# Patient Record
Sex: Male | Born: 1950 | Race: Black or African American | Hispanic: No | Marital: Single | Smoking: Current every day smoker
Health system: Southern US, Community
[De-identification: ages and names within clinical notes are randomized; demographics above are authoritative.]

## PROBLEM LIST (undated history)

## (undated) DIAGNOSIS — I10 Essential (primary) hypertension: Secondary | ICD-10-CM

---

## 2009-10-19 ENCOUNTER — Emergency Department: Payer: Self-pay | Admitting: Emergency Medicine

## 2010-10-20 ENCOUNTER — Emergency Department: Payer: Self-pay | Admitting: Emergency Medicine

## 2012-05-23 ENCOUNTER — Inpatient Hospital Stay: Payer: Self-pay | Admitting: Psychiatry

## 2012-05-23 LAB — COMPREHENSIVE METABOLIC PANEL
Albumin: 3.6 g/dL (ref 3.4–5.0)
Alkaline Phosphatase: 98 U/L (ref 50–136)
Anion Gap: 5 — ABNORMAL LOW (ref 7–16)
BUN: 11 mg/dL (ref 7–18)
Calcium, Total: 8.9 mg/dL (ref 8.5–10.1)
EGFR (African American): >60
Glucose: 72 mg/dL (ref 65–99)
SGOT(AST): 53 U/L — ABNORMAL HIGH (ref 15–37)
SGPT (ALT): 60 U/L (ref 12–78)
Total Protein: 7.8 g/dL (ref 6.4–8.2)

## 2012-05-23 LAB — CBC
HCT: 43.4 % (ref 40.0–52.0)
MCH: 35.1 pg — ABNORMAL HIGH (ref 26.0–34.0)
MCHC: 34.7 g/dL (ref 32.0–36.0)
RDW: 13.1 % (ref 11.5–14.5)

## 2012-05-23 LAB — ETHANOL
Ethanol %: 0.023 % (ref 0.000–0.080)
Ethanol: 23 mg/dL

## 2012-05-23 LAB — DRUG SCREEN, URINE
Amphetamines, Ur Screen: NEGATIVE (ref ?–1000)
Benzodiazepine, Ur Scrn: NEGATIVE (ref ?–200)
Cannabinoid 50 Ng, Ur ~~LOC~~: NEGATIVE (ref ?–50)
Cocaine Metabolite,Ur ~~LOC~~: POSITIVE (ref ?–300)
Methadone, Ur Screen: NEGATIVE (ref ?–300)
Opiate, Ur Screen: NEGATIVE (ref ?–300)
Phencyclidine (PCP) Ur S: NEGATIVE (ref ?–25)

## 2012-05-23 LAB — URINALYSIS, COMPLETE
Blood: NEGATIVE
Leukocyte Esterase: NEGATIVE
Nitrite: NEGATIVE
Protein: NEGATIVE
RBC,UR: 2 /HPF (ref 0–5)
Specific Gravity: 1.024 (ref 1.003–1.030)
WBC UR: 5 /HPF (ref 0–5)

## 2012-05-23 LAB — TSH: Thyroid Stimulating Horm: 4.12 u[IU]/mL

## 2012-05-23 LAB — ACETAMINOPHEN LEVEL: Acetaminophen: <2 ug/mL

## 2012-05-24 LAB — BEHAVIORAL MEDICINE 1 PANEL
Alkaline Phosphatase: 127 U/L (ref 50–136)
Anion Gap: 4 — ABNORMAL LOW (ref 7–16)
BUN: 15 mg/dL (ref 7–18)
Basophil #: 0.1 10*3/uL (ref 0.0–0.1)
Bilirubin,Total: 0.3 mg/dL (ref 0.2–1.0)
Calcium, Total: 8.6 mg/dL (ref 8.5–10.1)
Chloride: 111 mmol/L — ABNORMAL HIGH (ref 98–107)
Co2: 27 mmol/L (ref 21–32)
Creatinine: 0.84 mg/dL (ref 0.60–1.30)
EGFR (Non-African Amer.): >60
Eosinophil #: 0.4 10*3/uL (ref 0.0–0.7)
Eosinophil %: 6.4 %
HCT: 39.9 % — ABNORMAL LOW (ref 40.0–52.0)
Lymphocyte %: 33.3 %
MCH: 34.8 pg — ABNORMAL HIGH (ref 26.0–34.0)
MCHC: 34.2 g/dL (ref 32.0–36.0)
Monocyte #: 0.6 x10 3/mm (ref 0.2–1.0)
Neutrophil #: 3.1 10*3/uL (ref 1.4–6.5)
Neutrophil %: 50.2 %
Potassium: 4.1 mmol/L (ref 3.5–5.1)
RDW: 13.2 % (ref 11.5–14.5)
SGOT(AST): 37 U/L (ref 15–37)
SGPT (ALT): 47 U/L (ref 12–78)
Sodium: 142 mmol/L (ref 136–145)
Thyroid Stimulating Horm: 1.73 u[IU]/mL
Total Protein: 6.7 g/dL (ref 6.4–8.2)

## 2012-05-25 LAB — LIPID PANEL
Ldl Cholesterol, Calc: 46 mg/dL (ref 0–100)
Triglycerides: 79 mg/dL (ref 0–200)

## 2012-05-25 LAB — CK-MB: CK-MB: 1.4 ng/mL (ref 0.5–3.6)

## 2012-05-25 LAB — TROPONIN I: Troponin-I: <0.02 ng/mL

## 2012-05-26 DIAGNOSIS — R079 Chest pain, unspecified: Secondary | ICD-10-CM

## 2013-06-19 ENCOUNTER — Emergency Department: Payer: Self-pay | Admitting: Emergency Medicine

## 2014-07-06 ENCOUNTER — Ambulatory Visit: Payer: Self-pay | Admitting: Surgery

## 2014-07-12 ENCOUNTER — Ambulatory Visit: Payer: Self-pay | Admitting: Surgery

## 2014-07-12 LAB — PROTIME-INR
INR: 1
Prothrombin Time: 13.5 secs (ref 11.5–14.7)

## 2014-07-12 LAB — BASIC METABOLIC PANEL
Anion Gap: 6 — ABNORMAL LOW (ref 7–16)
BUN: 12 mg/dL (ref 7–18)
CALCIUM: 8.8 mg/dL (ref 8.5–10.1)
CO2: 24 mmol/L (ref 21–32)
Chloride: 110 mmol/L — ABNORMAL HIGH (ref 98–107)
Creatinine: 0.78 mg/dL (ref 0.60–1.30)
EGFR (African American): >60
GLUCOSE: 96 mg/dL (ref 65–99)
Osmolality: 279 (ref 275–301)
POTASSIUM: 4.4 mmol/L (ref 3.5–5.1)
SODIUM: 140 mmol/L (ref 136–145)

## 2014-07-20 ENCOUNTER — Inpatient Hospital Stay: Payer: Self-pay | Admitting: Surgery

## 2014-07-21 LAB — PLATELET COUNT: PLATELETS: 199 10*3/uL (ref 150–440)

## 2014-07-23 LAB — CBC WITH DIFFERENTIAL/PLATELET
BASOS PCT: 0.4 %
Basophil #: 0.1 10*3/uL (ref 0.0–0.1)
EOS PCT: 0.2 %
Eosinophil #: 0 10*3/uL (ref 0.0–0.7)
HCT: 49.9 % (ref 40.0–52.0)
HGB: 16.2 g/dL (ref 13.0–18.0)
LYMPHS ABS: 2.4 10*3/uL (ref 1.0–3.6)
Lymphocyte %: 16.5 %
MCH: 32 pg (ref 26.0–34.0)
MCHC: 32.3 g/dL (ref 32.0–36.0)
MCV: 99 fL (ref 80–100)
Monocyte #: 1.3 x10 3/mm — ABNORMAL HIGH (ref 0.2–1.0)
Monocyte %: 9.1 %
Neutrophil #: 10.6 10*3/uL — ABNORMAL HIGH (ref 1.4–6.5)
Neutrophil %: 73.8 %
PLATELETS: 200 10*3/uL (ref 150–440)
RBC: 5.05 10*6/uL (ref 4.40–5.90)
RDW: 12.4 % (ref 11.5–14.5)
WBC: 14.4 10*3/uL — AB (ref 3.8–10.6)

## 2014-07-23 LAB — BASIC METABOLIC PANEL
ANION GAP: 6 — AB (ref 7–16)
BUN: 18 mg/dL (ref 7–18)
Calcium, Total: 8.7 mg/dL (ref 8.5–10.1)
Chloride: 102 mmol/L (ref 98–107)
Co2: 29 mmol/L (ref 21–32)
Creatinine: 0.93 mg/dL (ref 0.60–1.30)
EGFR (Non-African Amer.): >60
GLUCOSE: 125 mg/dL — AB (ref 65–99)
OSMOLALITY: 277 (ref 275–301)
Potassium: 4.3 mmol/L (ref 3.5–5.1)
Sodium: 137 mmol/L (ref 136–145)

## 2014-07-23 LAB — CLOSTRIDIUM DIFFICILE(ARMC)

## 2014-07-24 LAB — CBC WITH DIFFERENTIAL/PLATELET
BASOS ABS: 0.1 10*3/uL (ref 0.0–0.1)
BASOS PCT: 0.8 %
Eosinophil #: 0.1 10*3/uL (ref 0.0–0.7)
Eosinophil %: 1.2 %
HCT: 47 % (ref 40.0–52.0)
HGB: 15.6 g/dL (ref 13.0–18.0)
LYMPHS PCT: 22.1 %
Lymphocyte #: 2.5 10*3/uL (ref 1.0–3.6)
MCH: 32.4 pg (ref 26.0–34.0)
MCHC: 33.1 g/dL (ref 32.0–36.0)
MCV: 98 fL (ref 80–100)
Monocyte #: 1.2 x10 3/mm — ABNORMAL HIGH (ref 0.2–1.0)
Monocyte %: 10.6 %
NEUTROS PCT: 65.3 %
Neutrophil #: 7.4 10*3/uL — ABNORMAL HIGH (ref 1.4–6.5)
Platelet: 202 10*3/uL (ref 150–440)
RBC: 4.81 10*6/uL (ref 4.40–5.90)
RDW: 12.5 % (ref 11.5–14.5)
WBC: 11.4 10*3/uL — ABNORMAL HIGH (ref 3.8–10.6)

## 2014-10-12 NOTE — Consult Note (Signed)
Chief Complaint:   Subjective/Chief Complaint Pt with left neck/posterior shoulder pain radiating to left arm. BP elevated. Denies CP or SOB.   VITAL SIGNS/ANCILLARY NOTES: **Vital Signs.:   29-Nov-13 13:10   Temperature Temperature (F) 96.2   Pulse Pulse 68    17:31   Temperature Temperature (F) 97.8   Pulse Pulse 62   Systolic BP Systolic BP 170   Diastolic BP (mmHg) Diastolic BP (mmHg) 93    18:58   Temperature Temperature (F) 98.2   Pulse Pulse 76   Systolic BP Systolic BP 144   Diastolic BP (mmHg) Diastolic BP (mmHg) 84    21:40   Temperature Temperature (F) 98.6   Pulse Pulse 72   Systolic BP Systolic BP 212   Diastolic BP (mmHg) Diastolic BP (mmHg) 112    21:58   Systolic BP Systolic BP 200   Diastolic BP (mmHg) Diastolic BP (mmHg) 115    22:18   Pulse Ox % Pulse Ox % 99   Brief Assessment:   Cardiac Regular    Respiratory clear BS    Gastrointestinal details normal Soft  Nontender  Bowel sounds normal   Assessment/Plan:  Assessment/Plan:   Plan EKG and CXR OK. Will check troponin. Begin NTP and clonidine. Add Flexeril for musculoskeletal pain. Will follow.   Electronic Signatures: Marguarite ArbourSparks, Egon Dittus D (MD)  (Signed [phone]-Nov-13 22:51)  Authored: Chief Complaint, VITAL SIGNS/ANCILLARY NOTES, Brief Assessment, Assessment/Plan   Last Updated: 29-Nov-13 22:51 by Marguarite ArbourSparks, Nasean Zapf D (MD)

## 2014-10-12 NOTE — Consult Note (Signed)
PATIENT NAME:  Micheal Dunlap, Micheal Dunlap MR#:  [id number] DATE OF BIRTH:  [birth year]  DATE OF CONSULTATION:  05/23/2012  REFERRING PHYSICIAN:  Franki Cabot, MD CONSULTING PHYSICIAN:  Duane Lope. Bunyan Brier, MD  REASON FOR CONSULTATION: Accelerated hypertension with left shoulder pain.   HISTORY OF PRESENT ILLNESS: The patient is a 64 year old male with a history of previous back surgery and alcohol dependence admitted to the Behavioral Medicine Unit for alcohol abuse and detox. The patient was admitted earlier today. Subsequently, the patient has developed left neck and posterior shoulder pain radiating down the left arm. Denies chest pain or shortness of breath. Was noted to have accelerated hypertension at the time. Was given 0.1 mg of clonidine. Consultation was subsequently requested. The patient denies any previous cardiac history.   PAST MEDICAL HISTORY:  1. Status post previous lumbar back surgery. 2. Alcohol dependence.   MEDICATIONS ON ADMISSION: None.   ALLERGIES: No known drug allergies.   SOCIAL HISTORY: The patient has a history of both alcohol and tobacco abuse.   FAMILY HISTORY: Negative for prostate or colon cancer. Negative for coronary artery disease.   REVIEW OF SYSTEMS. CONSTITUTIONAL: No fever or change in weight. EYES: No blurred or double vision. No glaucoma. ENT: No tinnitus or hearing loss. No nasal discharge or bleeding. No difficulty swallowing. RESPIRATORY: No cough or wheezing. Denies hemoptysis. No orthopnea. CARDIOVASCULAR: No chest pain or palpitations. No syncope. GI: No nausea, vomiting, or diarrhea. No change in bowel habits. No abdominal pain. GU: No dysuria or hematuria. No incontinence. ENDOCRINE: No polyuria or polydipsia. No heat or cold intolerance. HEMATOLOGIC: The patient denies anemia, easy bruising, or bleeding. LYMPHATIC: No swollen glands. MUSCULOSKELETAL: The patient denies pain in his back, knees, or hips. No gout. NEUROLOGIC: No numbness or  weakness. Denies stroke or seizures.  PSYCH: The patient denies anxiety, insomnia, or depression.   PHYSICAL EXAMINATION:  GENERAL: The patient is in no acute distress.   VITAL SIGNS: Vital signs are currently remarkable for a blood pressure of 200/115 with a heart rate of 72 and a respiratory rate of 18. He is afebrile. Saturations 99% on room air.   HEENT: Normocephalic, atraumatic. Pupils equally round and reactive to light and accommodation. Extraocular movements are intact. Sclerae are anicteric. Conjunctivae are clear. Oropharynx is clear.   NECK: Supple without jugular venous distention. No adenopathy or thyromegaly was noted.    LUNGS: Clear to auscultation and percussion without wheezes, rales, or rhonchi. No dullness.   CARDIAC: Regular rate and rhythm with a normal S1, S2. No significant rubs, murmurs, or gallops. PMI is nondisplaced. Chest wall is nontender.   ABDOMEN: Soft, nontender with normoactive bowel sounds. No organomegaly or masses were appreciated. No hernias or bruits were noted.   EXTREMITIES: Without clubbing, cyanosis, edema. Pulses were 2+ bilaterally.   SKIN: Warm and dry without rash or lesions.   NEUROLOGIC: Cranial nerves II through XII grossly intact. Deep tendon reflexes were symmetric. Motor and sensory exam is nonfocal.   PSYCH: Exam revealed a patient who is alert and oriented to person, place, and time. He was cooperative and used good judgment.   LABORATORY DATA: EKG revealed sinus rhythm with no acute ischemic changes. Chest x-ray was unremarkable.   ASSESSMENT:  1. Accelerated hypertension.  2. Alcohol dependency with withdrawal.   3. Musculoskeletal neck and shoulder pain.   PLAN: The patient will be placed on nitroglycerin paste. We will check a troponin. Begin clonidine 0.2 mg p.o. q.8 hours routinely. We will monitor his  blood pressure closely. We will add Flexeril for his musculoskeletal pain at this time. Other plans per Psychiatry.  Thank you for the consultation. We will continue to follow this patient with you while in the hospital. Please call if questions arise.   TOTAL TIME SPENT ON THIS PATIENT: 45 minutes.     ____________________________ Duane LopeJeffrey D. Judithann SheenSparks, MD jds:vtd D: 05/23/2012 22:57:59 ET T: 05/24/2012 09:51:15 ET JOB#: [id number]  cc: Duane LopeJeffrey D. Judithann SheenSparks, MD, <Dictator> Ragan Reale Rodena Medin Macaila Tahir MD ELECTRONICALLY SIGNED 05/25/2012 13:18

## 2014-10-18 LAB — SURGICAL PATHOLOGY

## 2014-10-24 NOTE — Discharge Summary (Signed)
PATIENT NAME:  Micheal Dunlap, Micheal Dunlap MR#:  [id number] DATE OF BIRTH:  23-Oct-1950  DATE OF ADMISSION:  07/20/2014 DATE OF DISCHARGE:  07/24/2014  HISTORY OF PRESENT ILLNESS:  This 64 year old male was admitted for a laparoscopic right colectomy. He had a recent colonoscopy with findings of a 30 mm semi-sessile polyp found at the hepatic flexure. A portion of this polyp was removed with saline injection lift technique and hot snare. Resection was incomplete. The site was injected with 2 mm of carbon black. Pathology demonstrated a tubular adenoma.   PAST MEDICAL HISTORY:  Also includes hepatitis C.   Other details are recorded on the typed H and P.   HOSPITAL COURSE:  The patient came in through the outpatient surgery department prepared for surgery. He did have a preoperative prophylactic antibiotic and was carried to the operating room, where he had a laparoscopic right colectomy. Postoperatively, he was kept in the hospital for routine care and was given IV fluids. He was initially begun on a clear liquid diet and gradually advanced to full liquids and to solid food.   While in the hospital, he did have significant hypertension with diastolic pressure higher than 045100. He was treated with Vasotec and gradually increased on the dose up to 10 mg 2 times per day. He also had internal medicine consultation. Amlodipine 5 mg per day was added, and his blood pressure was normal after arriving at this course of treatment.   His wound progressed satisfactorily.   Final pathology demonstrated 2 tubular adenomas of the right colon.   FINAL DIAGNOSES:  1.  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  2.  Hypertension.   DISCHARGE INSTRUCTIONS:  Include taking Vasotec 10 mg 2 times per day and amlodipine 5 mg one time per day.   Wound care instructions were given.   Take a regular diet, but limit salt.   PLAN:  Follow up in the office and also will need followup internal medicine survey care for his  hypertension.   ____________________________ Shela CommonsJ. Renda RollsWilton Smith, MD jws:nb D: 08/02/2014 17:19:08 ET T: 08/03/2014 00:27:36 ET JOB#: [id number]  cc: Adella HareJ. Wilton Smith, MD, <Dictator> Adella HareWILTON J SMITH MD ELECTRONICALLY SIGNED 08/05/2014 15:19

## 2014-10-24 NOTE — Consult Note (Signed)
PATIENT NAME:  Micheal Dunlap, Micheal Dunlap MR#:  [id number] DATE OF BIRTH:  Nov 11, 1950  DATE OF CONSULTATION:  07/22/2014  REFERRING PHYSICIAN:   Renda Rolls, MD  CONSULTING PHYSICIAN:  Hope Pigeon. Elisabeth Pigeon, MD  REASON FOR CONSULTATION:  Hypertension.   HISTORY OF PRESENT ILLNESS: This is a 64 year old male who has no known medical history, and he is not following with any doctor, said that last month he just walked into [facility] and said he has to have a work up for cancer.  He wanted to rule it out and he was referred to Dr. Shelle Iron.  He did a colonoscopy and found he has tubular adenoma and was referred to Dr. Michaelle Copas office.  Dr. Katrinka Blazing called him for scheduled surgery and laparoscopic right colectomy, because of tubular adenoma.  Though I do not believe this story in detail, but he got right hemicolectomy for tubular adenoma and he is admitted for that on the surgical floor currently.  His blood pressure is running high, and so medical services called in for further management of that.  He denies any complaints, started walking after surgery and tolerating fine liquid diet and had a bowel movement today.   REVIEW OF SYSTEMS: CONSTITUTIONAL: Negative for fever, fatigue, weakness, pain or weight loss.  EYES: No blurring, double vision, discharge or redness.  EARS, NOSE, THROAT: No tinnitus, ear pain or hearing loss.  RESPIRATORY: No cough, wheezing, hemoptysis, or shortness of breath.  CARDIOVASCULAR: No chest pain, orthopnea, edema, arrhythmia, palpitations.  GASTROINTESTINAL: No nausea, vomiting, diarrhea, had abdominal pain.  GENITOURINARY: No dysuria, hematuria, increased frequency.  ENDOCRINE: No heat or cold intolerance. No excessive sweating.  SKIN: No acne, rashes, or lesions.  MUSCULOSKELETAL: No pain or swelling in the joints.  NEUROLOGICAL: No numbness, weakness, tremor or vertigo.  PSYCHIATRIC: No anxiety, insomnia, bipolar disorder.   PAST MEDICAL HISTORY: Not known, as he  does not go to any doctor.   PAST SURGICAL HISTORY: He had right hemicolectomy laparoscopy he is done on 07/20/2014, 2 days ago.   SOCIAL HISTORY: Lives with his mother, independent in day-to-day activity. He does not work.  He smokes 2 to 3 cigarettes every day. Denies alcohol or illegal drug use.   FAMILY HISTORY: Negative for coronary artery disease, diabetes.  His mother is alive and healthy, and father died of old age.   HOME MEDICATIONS: None.   PHYSICAL EXAMINATION:  VITAL SIGNS: Blood pressure 162/110, respirations 18, heart rate 98, temperature 97.2, and pulse oximetry is 96% on room air.  GENERAL: The patient is fully alert and oriented to time, place, and person. Does not appear in any acute distress.  HEENT:  Head atraumatic, PERRLA, mucous membranes are moist.  NECK: Supple. No JVD, thyroid non tender.  RESPIRATORY: Bilateral equal and clear air entry. CARDIOVASCULAR: S1, S2 present. No wheezing or crepitation, no palpitations. No tenderness on local palpation.   ABDOMEN: There is a scar of surgery and dressing present.  Abdomen is slightly distended, but bowel sounds present and mild tenderness over surgical site. No organomegaly felt.  SKIN: No acne, rashes, or lesions.  MUSCULOSKELETAL: No tenderness or swelling.  EXTREMITIES:  Legs: No edema.  NEUROLOGICAL: Power 5/5, follows commands. No tremor or rigidity. Sensation is intact.  PSYCHIATRIC: Does not appear in any acute psychiatric illness at this time.   IMPORTANT LABORATORY RESULTS:  Platelet count 199,000. There are no other labs done as he is admitted primarily to surgical service.   ASSESSMENT AND PLAN:  A 64 year old male  who has no known past medical history, but does not follow with any doctor.  Medical consult is called in for hypertension management after he is admitted to surgical service for laparoscopic right hemicolectomy.   1.  Hypertension.  He is already on medication. I will add amlodipine.  He was on IV  fluid, which is stopped now, so hopefully that will also help to better control his blood pressure.  2.  Tubular adenoma, status post hemicolectomy. This was already done by surgery and management per primary surgical team. He appears already recovering from that.  3.  Smoking. Smoking cessation counseling is done for 4 minutes and offered him supplement, but he does not want that and he is fine without that.    TOTAL TIME SPENT ON THIS CONSULT: 45 minutes. We will continue following with you.     ____________________________ Hope PigeonVaibhavkumar G. Elisabeth PigeonVachhani, MD vgv:DT D: 07/22/2014 14:16:53 ET T: 07/22/2014 15:11:44 ET JOB#: [id number]  cc: Hope PigeonVaibhavkumar G. Elisabeth PigeonVachhani, MD, <Dictator> Altamese DillingVAIBHAVKUMAR Jakobie Henslee MD ELECTRONICALLY SIGNED 08/10/2014 10:41

## 2014-10-24 NOTE — Op Note (Signed)
PATIENT NAME:  Micheal Dunlap, Micheal Dunlap MR#:  [id number] DATE OF BIRTH:  05/20/1951  DATE OF PROCEDURE:  07/20/2014  PREOPERATIVE DIAGNOSIS: Tubular adenoma of the hepatic flexure of the colon.   POSTOPERATIVE DIAGNOSIS: Tubular adenoma of the hepatic flexure of the colon.   PROCEDURE: Laparoscopic right colectomy.   SURGEON: Adella HareJ. Wilton Smith, MD   ANESTHESIA: General.   INDICATIONS: This 64 year old male recently had colonoscopy findings of a 30 mm tubular adenoma of the hepatic flexure of the colon and parts of this could be removed, but could not all be removed and surgery was recommended for definitive treatment. Dye was injected locally during the colonoscopy.   DESCRIPTION OF PROCEDURE: The patient was placed on the operating table in the supine position under general endotracheal anesthesia. The abdomen was prepared with ChloraPrep, draped in a sterile manner.   A short incision was made just below the umbilicus, carried down to the deep fascia, and lifted the fascia with the laryngeal hook. A Veress needle was inserted into the peritoneal cavity. It was irrigated and aspirated, and subsequently insufflated the peritoneal cavity with carbon dioxide. The Veress needle was removed. The 10 mm cannula was inserted. The 10 mm, 0-degree laparoscope was inserted to view the peritoneal cavity. There were some adhesions between the omentum and the falciform ligament. Another incision was made in the mid epigastrium at the midline to introduce an 11 mm cannula. Another incision was made in the right mid abdomen to introduce a 5 mm cannula lateral to the inferior epigastric vessels. Harmonic scalpel was used to take down adhesions between the omentum and the falciform ligament. The transverse colon was identified. Portions of the omentum were reflected in a cephalad direction for further exposure of the transverse colon. The cecum was identified. The patient was placed in the Trendelenburg position and  tilted towards the left. The cecum was better identified. The right colon was mobilized with incision of the lateral peritoneal reflection using the Harmonic scalpel. The appendix and terminal ileum were mobilized as well. Next, dissection was carried out to mobilize the right transverse colon. I did identify the duodenum. Next,  additional portions of the right colon, appendix, and terminal ileum were further mobilized. This mobilization continued as a tedious dissection until the right colon could be brought well over towards the left side, demonstrating its mobility. Next, the laparoscopic instruments were removed. The right mid abdominal and the hypogastric incisions were closed with clips. Next, the incision was made from one port site to another in the epigastrium and then lengthened it in both directions until the right colon could be brought out on the abdominal wall. There was some minimal evidence of tattooing in the right transverse mesentery, although no grossly palpable mass. There was no palpable mass within the liver. The window was created in the transverse mesocolon just to the right of the middle colic vessels and began the mesenteric dissection with the Harmonic scalpel. Also,-created a window in the mesentery of the terminal ileum, some 5 inches proximal to the ileocecal valve and began that dissection with the Harmonic scalpel. It is noted that the ileocolic artery and vein were doubly ligated with 0 chromic and divided with the Harmonic scalpel. The small bowel was brought adjacent to the transverse colon and held with Allis clamps and elevated. A colotomy and an enterotomy were each made and introduced the 75 mm GIA stapler to begin the anastomosis along the antimesenteric border of the colon and of the small bowel using  approximately three-fourths of the staple line. Next, the staple line was inspected and found to be hemostatic. The anastomosis was completed with application of the TA-60  stapler, which was placed perpendicular to the first, engaged and activated, and the specimen was excised sharply with a scalpel and passed off to a side table. The anastomosis was inspected and it appeared to be widely patent. The apex of the staple line was imbricated with 5-0 Vicryl. The junction of the staple lines was also imbricated with 5-0 Vicryl. The mesenteric defect was closed with running 3-0 chromic. The hemostasis appeared to be intact. Pull suction was placed along the right colic gutter, and there was minimal amount of blood found. Next, the omentum was brought beneath the wound.   Gloves, gowns, instruments, and cover sheets were replaced for clean ones. Next, the fascial repair was carried out with interrupted 0 Maxon figure-of-eight sutures and further demonstrated hemostasis was intact. The skin was closed with clips. Dressings were applied with paper tape. The patient tolerated surgery satisfactorily and was prepared for transfer to the recovery room.     ____________________________ Shela Commons. Renda Rolls, MD jws:mw D: 07/20/2014 15:01:25 ET T: 07/20/2014 17:02:19 ET JOB#: [id number]  cc: Adella Hare, MD, <Dictator> Adella Hare MD ELECTRONICALLY SIGNED 07/20/2014 18:36

## 2014-11-02 NOTE — H&P (Signed)
PATIENT NAME: Micheal Dunlap, Micheal Dunlap OF BIRTH:  1950-07-16 OF ADMISSION:  05/23/2012  REFERRING PHYSICIAN:   Daryel NovemberJonathan Williams, MD PHYSICIAN:  Hero Mccathern B. Ethan Kasperski, MD  DATA: Mr. Micheal Dunlap is a 64 year old male with a history of cognitive impairment.   COMPLAINT: "I need detox."  OF PRESENT ILLNESS: Mr. Micheal Dunlap has been drinking most of his life, lately, at a rate of 12 beers and a bottle of wine daily and $300 worth of cocaine weekly. His use escalated over the past few years and especially in the past two months. He denies any symptoms of depression, anxiety or psychosis. There are no symptoms suggestive of bipolar mania. He does not use prescription pills. He decided to seek substance abuse treatment for the first time in his life.  PSYCHIATRIC HISTORY: The patient has never been treated before. There were no suicide attempts.   PSYCHIATRIC HISTORY: "Everybody is an alcoholic."  MEDICAL HISTORY:  Back problems. 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 NO known drug allergy. ON ADMISSION: None. HISTORY: The patient completed 11th grade. He has been disabled for the past 3 years from back problems. He lives with his mother. He has Medicare and Medicaid.   OF SYSTEMS: CONSTITUTIONAL: No fevers or chills. Positive for 40 lbs weight loss in the past 2 months. EYES: One glass eye. ENT: No hearing loss. RESPIRATORY: No shortness of breath or cough. CARDIOVASCULAR: No chest pain or orthopnea. GASTROINTESTINAL: No abdominal pain, nausea, vomiting, or diarrhea. GU: No incontinence or frequency. ENDOCRINE: No heat or cold intolerance. LYMPHATIC: No anemia or easy bruising. INTEGUMENTARY: No acne or rash. MUSCULOSKELETAL: Positive for back pain. NEUROLOGIC: No tingling or weakness. PSYCHIATRIC: See history of present illness for details.  EXAMINATION: VITAL SIGNS: Blood pressure 166/96, pulse 68, respirations 18, temperature 96.2. GENERAL: This is a slender male in no acute distress. HEENT: The pupil of left eye is reactive  to light. Sclerae are anicteric. NECK: Supple. No thyromegaly. LUNGS: Clear to auscultation. No dullness to percussion. HEART: Regular rhythm and rate. No murmurs, rubs, or gallops. ABDOMEN: Soft, nontender, nondistended. Positive bowel sounds. MUSCULOSKELETAL: Normal muscle strength in all extremities. SKIN: No rashes or bruises. LYMPHATIC: No cervical adenopathy. NEUROLOGIC: Cranial nerves II through XII are intact.  DIAGNOSTIC AND RADIOLOGICAL DATA: Chemistries are within normal limits. Blood alcohol level 0.023. LFTs are within normal limits except ALT 53. TSH 4.12. Urine tox screen positive for cocaine. CBC within normal limits, MCV 101. Urinalysis is not suggestive of urinary tract infection.  STATUS EXAMINATION ON ADMISSION: The patient is asleep but easily arousable. He is oriented to person, place, and situation. He knows it is November 2013 and Thanksgiving is coming. He is pleasant, polite, and cooperative. He maintains limited eye contact. He is in bed wearing hospital scrubs. Mood is fine with flat affect. Thought processing is logical. Thought content: He denies suicidal or homicidal ideation. There are no delusions or paranoia. He denies hallucinations. His cognition is grossly intact. He registers three out of three, recalls two out of three objects after five minutes. He can spell cat forward and backward. His insight and judgment are questionable.  RISK ASSESSMENT ON ADMISSION: This is a patient with a long history of substance abuse here for psychiatric treatment who came to the hospital after a suicide attempt.  DIAGNOSES: I:  Alcohol dependence.   Cocaine dependence.  II:  Deferred.  III:  Back problems, IV:  Substance abuse.  V:  GAF on admission 35.  The patient was admitted to [facility] Medicine Unit  for safety, stabilization and medication management. He was initially placed on suicide precautions and was closely monitored for any unsafe behaviors. He  underwent full psychiatric and risk assessment. He received pharmacotherapy, individual and group psychotherapy, substance abuse counseling, and support from therapeutic milieu.   1. Alcohol detox: The patient is placed on CIWA protocol.   Substance abuse treatment: The patient desires residential treatment.   Disposition: TBE.   Electronic Signatures: Kristine LineaPucilowska, Jaquilla Woodroof (MD)  (Signed on 01-Dec-13 07:02)  Authored  Last Updated: 01-Dec-13 07:02 by Kristine LineaPucilowska, Zeeva Courser (MD)

## 2014-11-02 NOTE — H&P (Signed)
PATIENT NAME: Micheal Dunlap, [name] OF BIRTH:  10/09/1950 OF ADMISSION:  05/23/2012  REFERRING PHYSICIAN:   Daryel NovemberJonathan Williams, MD PHYSICIAN:  Zeah Germano B. Debrah Granderson, MD  DATA: Mr. Micheal Dunlap is a 64 year old male with a history of cognitive impairment.   COMPLAINT: "I need detox."  OF PRESENT ILLNESS: Mr. Micheal Dunlap has been drinking most of his life, lately, at a rate of 12 beers and a bottle of wine daily and $300 worth of cocaine weekly. His use escalated over the past few years and especially in the past two months. He denies any symptoms of depression, anxiety or psychosis. There are no symptoms suggestive of bipolar mania. He does not use prescription pills. He decided to seek substance abuse treatment for the first time in his life.  PSYCHIATRIC HISTORY: The patient has never been treated before. There were no suicide attempts.   PSYCHIATRIC HISTORY: "Everybody is an alcoholic."  MEDICAL HISTORY:  Back problems. 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 NO known drug allergy. ON ADMISSION: None. HISTORY: The patient completed 11th grade. He has been disabled for the past 3 years from back problems. He lives with his mother. He has Medicare and Medicaid.   OF SYSTEMS: CONSTITUTIONAL: No fevers or chills. Positive for 40 lbs weight loss in the past 2 months. EYES: One glass eye. ENT: No hearing loss. RESPIRATORY: No shortness of breath or cough. CARDIOVASCULAR: No chest pain or orthopnea. GASTROINTESTINAL: No abdominal pain, nausea, vomiting, or diarrhea. GU: No incontinence or frequency. ENDOCRINE: No heat or cold intolerance. LYMPHATIC: No anemia or easy bruising. INTEGUMENTARY: No acne or rash. MUSCULOSKELETAL: Positive for back pain. NEUROLOGIC: No tingling or weakness. PSYCHIATRIC: See history of present illness for details.  EXAMINATION: VITAL SIGNS: Blood pressure 166/96, pulse 68, respirations 18, temperature 96.2. GENERAL: This is a slender male in no acute distress. HEENT: The pupil of left eye is reactive  to light. Sclerae are anicteric. NECK: Supple. No thyromegaly. LUNGS: Clear to auscultation. No dullness to percussion. HEART: Regular rhythm and rate. No murmurs, rubs, or gallops. ABDOMEN: Soft, nontender, nondistended. Positive bowel sounds. MUSCULOSKELETAL: Normal muscle strength in all extremities. SKIN: No rashes or bruises. LYMPHATIC: No cervical adenopathy. NEUROLOGIC: Cranial nerves II through XII are intact.  DIAGNOSTIC AND RADIOLOGICAL DATA: Chemistries are within normal limits. Blood alcohol level 0.023. LFTs are within normal limits except ALT 53. TSH 4.12. Urine tox screen positive for cocaine. CBC within normal limits, MCV 101. Urinalysis is not suggestive of urinary tract infection.  STATUS EXAMINATION ON ADMISSION: The patient is asleep but easily arousable. He is oriented to person, place, and situation. He knows it is November 2013 and Thanksgiving is coming. He is pleasant, polite, and cooperative. He maintains limited eye contact. He is in bed wearing hospital scrubs. Mood is fine with flat affect. Thought processing is logical. Thought content: He denies suicidal or homicidal ideation. There are no delusions or paranoia. He denies hallucinations. His cognition is grossly intact. He registers three out of three, recalls two out of three objects after five minutes. He can spell cat forward and backward. His insight and judgment are questionable.  RISK ASSESSMENT ON ADMISSION: This is a patient with a long history of substance abuse here for psychiatric treatment who came to the hospital after a suicide attempt.  DIAGNOSES: I:  Alcohol dependence.   Cocaine dependence.  II:  Deferred.  III:  Back problems, IV:  Substance abuse.  V:  GAF on admission 35.  The patient was admitted to [facility]  for safety, stabilization and medication management. He was initially placed on suicide precautions and was closely monitored for any unsafe behaviors. He  underwent full psychiatric and risk assessment. He received pharmacotherapy, individual and group psychotherapy, substance abuse counseling, and support from therapeutic milieu.   1. Alcohol detox: The patient is placed on CIWA protocol.   Substance abuse treatment: The patient desires residential treatment.   Disposition: TBE.   Electronic Signatures: Kristine LineaPucilowska, Idalis Hoelting (MD)  (Signed on 01-Dec-13 07:16)  Authored  Last Updated: 01-Dec-13 07:16 by Kristine LineaPucilowska, Kiyah Demartini (MD)

## 2014-12-03 ENCOUNTER — Other Ambulatory Visit: Payer: Self-pay | Admitting: Family Medicine

## 2016-04-27 ENCOUNTER — Emergency Department
Admission: EM | Admit: 2016-04-27 | Discharge: 2016-04-27 | Disposition: A | Discharge status: Home / Self Care | Payer: Medicare Other | Attending: Emergency Medicine | Admitting: Emergency Medicine

## 2016-04-27 ENCOUNTER — Emergency Department: Payer: Medicare Other

## 2016-04-27 ENCOUNTER — Encounter: Payer: Self-pay | Admitting: *Deleted

## 2016-04-27 DIAGNOSIS — R42 Dizziness and giddiness: Secondary | ICD-10-CM | POA: Diagnosis not present

## 2016-04-27 DIAGNOSIS — F17210 Nicotine dependence, cigarettes, uncomplicated: Secondary | ICD-10-CM | POA: Insufficient documentation

## 2016-04-27 DIAGNOSIS — I10 Essential (primary) hypertension: Secondary | ICD-10-CM | POA: Diagnosis not present

## 2016-04-27 HISTORY — DX: Essential (primary) hypertension: I10

## 2016-04-27 LAB — CBC
HEMATOCRIT: 43.7 % (ref 40.0–52.0)
Hemoglobin: 14.8 g/dL (ref 13.0–18.0)
MCH: 32.1 pg (ref 26.0–34.0)
MCHC: 33.9 g/dL (ref 32.0–36.0)
MCV: 94.7 fL (ref 80.0–100.0)
Platelets: 222 10*3/uL (ref 150–440)
RBC: 4.62 MIL/uL (ref 4.40–5.90)
RDW: 12.8 % (ref 11.5–14.5)
WBC: 8.4 10*3/uL (ref 3.8–10.6)

## 2016-04-27 LAB — BASIC METABOLIC PANEL
Anion gap: 6 (ref 5–15)
BUN: 14 mg/dL (ref 6–20)
CO2: 22 mmol/L (ref 22–32)
CREATININE: 0.92 mg/dL (ref 0.61–1.24)
Calcium: 9.2 mg/dL (ref 8.9–10.3)
Chloride: 109 mmol/L (ref 101–111)
GFR calc Af Amer: >60 mL/min (ref >60)
Glucose, Bld: 108 mg/dL — ABNORMAL HIGH (ref 65–99)
POTASSIUM: 3.9 mmol/L (ref 3.5–5.1)
SODIUM: 137 mmol/L (ref 135–145)

## 2016-04-27 LAB — TROPONIN I
Troponin I: <0.03 ng/mL (ref <0.03)
Troponin I: <0.03 ng/mL (ref <0.03)

## 2016-04-27 LAB — URINALYSIS COMPLETE WITH MICROSCOPIC (ARMC ONLY)
Bacteria, UA: NONE SEEN
Bilirubin Urine: NEGATIVE
Glucose, UA: 150 mg/dL — AB
HGB URINE DIPSTICK: NEGATIVE
KETONES UR: NEGATIVE mg/dL
LEUKOCYTES UA: NEGATIVE
NITRITE: NEGATIVE
PH: 5 (ref 5.0–8.0)
PROTEIN: NEGATIVE mg/dL
SPECIFIC GRAVITY, URINE: 1.01 (ref 1.005–1.030)
Squamous Epithelial / LPF: NONE SEEN

## 2016-04-27 NOTE — ED Triage Notes (Signed)
Pt reports having two episodes of chest pain with dizziness , once yesterday and once today, pt denies pain at time of triage, pt denies any other symptoms

## 2016-04-27 NOTE — ED Notes (Signed)
Pt given crackers and juice after okaying it with Dr. Deberah CastlePaduchoski.

## 2016-04-27 NOTE — ED Notes (Addendum)
Pt stating that he had an episode of dizziness and CP yesterday that made him pull over his vehicle. Pt stating that it passed. Pt stating that he had another episode like this around 10:30am left sided CP with dizziness. Pt stating that the pain was sharp. Pt stating that it seems to happen when he bends over. Pt stating he did have nausea this morning. Pt denying pain at this time. Pt stating that the dizziness "just comes" at times

## 2016-04-27 NOTE — ED Provider Notes (Signed)
[facility] Emergency Department Provider Note  Time seen: 2:16 PM  I have reviewed the triage vital signs and the nursing notes.   HISTORY  Chief Complaint Chest Pain    HPI Micheal Dunlap is a 65 y.o. male with a past medical history of hypertension who presents the emergency department for dizziness. According to the patient of the past 2 days he has been experiencing intermittent dizziness episode. He describes the episodes as feeling lightheaded like he might pass out. States he can hold onto a railing onto a wall for a minute or 2 and the symptoms passed. Denies any chest pain now or at any point. Denies any trouble breathing. Denies nausea but states he becomes flushed and warm feeling prior to the episodes. Denies actually passing out or falling. Patient denies any symptoms at this time.  Past Medical History:  Diagnosis Date  . Hypertension     There are no active problems to display for this patient.   No past surgical history on file.  Prior to Admission medications   Not on File    No Known Allergies  No family history on file.  Social History Social History  Substance Use Topics  . Smoking status: Current Every Day Smoker    Packs/day: 0.50    Types: Cigarettes  . Smokeless tobacco: Never Used  . Alcohol use No    Review of Systems Constitutional: Negative for fever.Positive for intermittent dizziness. Cardiovascular: Negative for chest pain. Respiratory: Negative for shortness of breath. Gastrointestinal: Negative for abdominal pain Musculoskeletal: Negative for back pain. Neurological: Negative for headache 10-point ROS otherwise negative.  ____________________________________________   PHYSICAL EXAM:  VITAL SIGNS: ED Triage Vitals [04/27/16 1045]  Enc Vitals Group     BP 128/70     Pulse Rate (!) 56     Resp 20     Temp 98 F (36.7 C)     Temp Source Oral     SpO2 98 %     Weight 185 lb (83.9 kg)   Height 5\' 9"  (1.753 m)     Head Circumference      Peak Flow      Pain Score      Pain Loc      Pain Edu?      Excl. in GC?     Constitutional: Alert and oriented. Well appearing and in no distress. Eyes: Normal exam ENT   Head: Normocephalic and atraumatic.   Mouth/Throat: Mucous membranes are moist. Cardiovascular: Normal rate, regular rhythm. No murmur Respiratory: Normal respiratory effort without tachypnea nor retractions. Breath sounds are clear  Gastrointestinal: Soft and nontender. No distention. Musculoskeletal: Nontender with normal range of motion in all extremities tenderness or edema. Neurologic:  Normal speech and language. No gross focal neurologic deficits  Skin:  Skin is warm, dry and intact.  Psychiatric: Mood and affect are normal.   ____________________________________________    EKG  EKG reviewed and interpreted by myself shows sinus bradycardia 56 bpm, narrow QRS, normal axis, normal intervals, no concerning ST changes, overall reassuring EKG.  ____________________________________________    RADIOLOGY  Chest x-ray normal.  ____________________________________________   INITIAL IMPRESSION / ASSESSMENT AND PLAN / ED COURSE  Pertinent labs & imaging results that were available during my care of the patient were reviewed by me and considered in my medical decision making (see chart for details).  The patient presents the emergency department with intermittent episodes of dizziness over the past 2 days. States his symptoms  seem to occur mostly when first standing. I discussed with the patient the need to increase oral fluids. Patient's labs are largely within normal limits including negative troponin. We'll repeat a troponin, send a urinalysis and continue to closely monitor in the emergency department. I discussed with the patient if this cardiac enzyme is negative we will likely be able to discharge him home as he continues to be symptom free in  the emergency department. However I discussed with the patient importance of following up with cardiology this week for a Holter monitor. The patient is agreeable to this plan. I discussed my normal dizziness return precautions including any chest pain or discomfort. Patient agreeable.  Repeat troponin negative. Urinalysis negative. We'll discharge the patient home with cardiology follow-up. Patient agreeable to this plan.  ____________________________________________   FINAL CLINICAL IMPRESSION(S) / ED DIAGNOSES  Dizziness    Minna AntisKevin Delane Wessinger, MD 04/27/16 [phone]

## 2016-04-27 NOTE — Discharge Instructions (Signed)
You have been seen in the emergency department for episodes of dizziness. Your workup in the emergency department is normal including cardiac enzymes. As we discussed please call the number provided for cardiology to arrange a follow-up appointment as soon as possible to discuss further testing including possible Holter monitor or stress test. Return to the emergency department he experience any chest pain, trouble breathing, pass out, or of any increased/concerning episodes.

## 2016-06-23 ENCOUNTER — Emergency Department: Payer: Medicare Other

## 2016-06-23 ENCOUNTER — Emergency Department
Admission: EM | Admit: 2016-06-23 | Discharge: 2016-06-23 | Disposition: A | Discharge status: Home / Self Care | Payer: Medicare Other | Attending: Student in an Organized Health Care Education/Training Program | Admitting: Student in an Organized Health Care Education/Training Program

## 2016-06-23 DIAGNOSIS — I10 Essential (primary) hypertension: Secondary | ICD-10-CM | POA: Insufficient documentation

## 2016-06-23 DIAGNOSIS — R42 Dizziness and giddiness: Secondary | ICD-10-CM

## 2016-06-23 DIAGNOSIS — E860 Dehydration: Secondary | ICD-10-CM | POA: Diagnosis not present

## 2016-06-23 DIAGNOSIS — Z5181 Encounter for therapeutic drug level monitoring: Secondary | ICD-10-CM | POA: Insufficient documentation

## 2016-06-23 DIAGNOSIS — F17210 Nicotine dependence, cigarettes, uncomplicated: Secondary | ICD-10-CM | POA: Diagnosis not present

## 2016-06-23 LAB — PROTIME-INR
INR: 1.02
Prothrombin Time: 13.4 seconds (ref 11.4–15.2)

## 2016-06-23 LAB — COMPREHENSIVE METABOLIC PANEL
ALBUMIN: 4.2 g/dL (ref 3.5–5.0)
ALT: 21 U/L (ref 17–63)
ANION GAP: 7 (ref 5–15)
AST: 24 U/L (ref 15–41)
Alkaline Phosphatase: 78 U/L (ref 38–126)
BILIRUBIN TOTAL: 0.3 mg/dL (ref 0.3–1.2)
BUN: 9 mg/dL (ref 6–20)
CO2: 26 mmol/L (ref 22–32)
Calcium: 9.4 mg/dL (ref 8.9–10.3)
Chloride: 106 mmol/L (ref 101–111)
Creatinine, Ser: 0.88 mg/dL (ref 0.61–1.24)
GFR calc Af Amer: >60 mL/min (ref >60)
GLUCOSE: 137 mg/dL — AB (ref 65–99)
Potassium: 3.7 mmol/L (ref 3.5–5.1)
Sodium: 139 mmol/L (ref 135–145)
TOTAL PROTEIN: 8.1 g/dL (ref 6.5–8.1)

## 2016-06-23 LAB — DIFFERENTIAL
Basophils Absolute: 0.1 10*3/uL (ref 0–0.1)
Basophils Relative: 1 %
EOS PCT: 5 %
Eosinophils Absolute: 0.3 10*3/uL (ref 0–0.7)
LYMPHS ABS: 1.8 10*3/uL (ref 1.0–3.6)
Lymphocytes Relative: 26 %
MONOS PCT: 9 %
Monocytes Absolute: 0.6 10*3/uL (ref 0.2–1.0)
NEUTROS PCT: 59 %
Neutro Abs: 4.1 10*3/uL (ref 1.4–6.5)

## 2016-06-23 LAB — CBC
HEMATOCRIT: 44.5 % (ref 40.0–52.0)
HEMOGLOBIN: 15.1 g/dL (ref 13.0–18.0)
MCH: 32.2 pg (ref 26.0–34.0)
MCHC: 34 g/dL (ref 32.0–36.0)
MCV: 94.7 fL (ref 80.0–100.0)
Platelets: 277 10*3/uL (ref 150–440)
RBC: 4.7 MIL/uL (ref 4.40–5.90)
RDW: 12.8 % (ref 11.5–14.5)
WBC: 6.9 10*3/uL (ref 3.8–10.6)

## 2016-06-23 LAB — TROPONIN I: Troponin I: <0.03 ng/mL (ref <0.03)

## 2016-06-23 LAB — APTT: aPTT: 27 seconds (ref 24–36)

## 2016-06-23 NOTE — ED Triage Notes (Signed)
Pt states that he has been dizzy for the past 4 months, pt has been seen by ENT and [facility] for the same. Pt states that he just cont to be dizzy, denies sob, denies chest pain

## 2016-06-23 NOTE — ED Notes (Signed)
FIRST NURSE NOTE: pt reports dizziness ongoing for the past 2 months. [facility]. States he is not currently dizzy but was dizzy earlier this morning.

## 2016-06-23 NOTE — ED Provider Notes (Signed)
[facility] Emergency Department Provider Note    None    (approximate)  I have reviewed the triage vital signs and the nursing notes.   HISTORY  Chief Complaint Dizziness    HPI Micheal Dunlap is a 65 y.o. male presents with intermittent lightheadedness and dizziness for 2 months. Patient states that the symptoms occur more Micheal Dunlap is rising from a seated position. Episodes last 5-10 minutes. They're not associated with shortness of breath or chest pain. There is no lateralizing weakness. Denies any blurry vision. States that he's been drinking milk and Powerade but has not been drinking much water. States he was also seen by ENT due to concern for vertigo. That was negative. He denies any weakness, numbness or tingling. Does not have a known history of CVA. His post a follow-up on the ninth of this month with cardiology. He denies any chest pressure or nausea.   Past Medical History:  Diagnosis Date  . Hypertension    No family history on file. No past surgical history on file. There are no active problems to display for this patient.     Prior to Admission medications   Not on File    Allergies Patient has no known allergies.    Social History Social History  Substance Use Topics  . Smoking status: Current Every Day Smoker    Packs/day: 0.50    Types: Cigarettes  . Smokeless tobacco: Never Used  . Alcohol use No    Review of Systems Patient denies headaches, rhinorrhea, blurry vision, numbness, shortness of breath, chest pain, edema, cough, abdominal pain, nausea, vomiting, diarrhea, dysuria, fevers, rashes or hallucinations unless otherwise stated above in HPI. ____________________________________________   PHYSICAL EXAM:  VITAL SIGNS: Vitals:   06/23/16 1115  BP: 118/66  Pulse: 63  Resp: 18  Temp: 98.4 F (36.9 C)    Constitutional: Alert and oriented. Well appearing and in no acute distress. Eyes: Conjunctivae  are normal. PERRL. EOMI. Head: Atraumatic. Nose: No congestion/rhinnorhea. Mouth/Throat: Mucous membranes are moist.  Oropharynx non-erythematous. Neck: No stridor. Painless ROM. No cervical spine tenderness to palpation Hematological/Lymphatic/Immunilogical: No cervical lymphadenopathy. Cardiovascular: Normal rate, regular rhythm. Grossly normal heart sounds.  Good peripheral circulation. Respiratory: Normal respiratory effort.  No retractions. Lungs CTAB. Gastrointestinal: Soft and nontender. No distention. No abdominal bruits. No CVA tenderness. Genitourinary:  Musculoskeletal: No lower extremity tenderness nor edema.  No joint effusions. Neurologic:  CN- intact.  No facial droop, Normal FNF.  Normal heel to shin.  Sensation intact bilaterally. Normal speech and language. No gross focal neurologic deficits are appreciated. No gait instability.  Skin:  Skin is warm, dry and intact. No rash noted. Psychiatric: Mood and affect are normal. Speech and behavior are normal.  ____________________________________________   LABS (all labs ordered are listed, but only abnormal results are displayed)  Results for orders placed or performed during the hospital encounter of 06/23/16 (from the past 24 hour(s))  Protime-INR     Status: None   Collection Time: 06/23/16 11:18 AM  Result Value Ref Range   Prothrombin Time 13.4 11.4 - 15.2 seconds   INR 1.02   APTT     Status: None   Collection Time: 06/23/16 11:18 AM  Result Value Ref Range   aPTT 27 24 - 36 seconds  CBC     Status: None   Collection Time: 06/23/16 11:18 AM  Result Value Ref Range   WBC 6.9 3.8 - 10.6 K/uL   RBC 4.70 4.40 -  5.90 MIL/uL   Hemoglobin 15.1 13.0 - 18.0 g/dL   HCT 16.1 09.6 - 04.5 %   MCV 94.7 80.0 - 100.0 fL   MCH 32.2 26.0 - 34.0 pg   MCHC 34.0 32.0 - 36.0 g/dL   RDW 40.9 81.1 - 91.4 %   Platelets 277 150 - 440 K/uL  Differential     Status: None   Collection Time: 06/23/16 11:18 AM  Result Value Ref Range     Neutrophils Relative % 59 %   Neutro Abs 4.1 1.4 - 6.5 K/uL   Lymphocytes Relative 26 %   Lymphs Abs 1.8 1.0 - 3.6 K/uL   Monocytes Relative 9 %   Monocytes Absolute 0.6 0.2 - 1.0 K/uL   Eosinophils Relative 5 %   Eosinophils Absolute 0.3 0 - 0.7 K/uL   Basophils Relative 1 %   Basophils Absolute 0.1 0 - 0.1 K/uL  Comprehensive metabolic panel     Status: Abnormal   Collection Time: 06/23/16 11:18 AM  Result Value Ref Range   Sodium 139 135 - 145 mmol/L   Potassium 3.7 3.5 - 5.1 mmol/L   Chloride 106 101 - 111 mmol/L   CO2 26 22 - 32 mmol/L   Glucose, Bld 137 (H) 65 - 99 mg/dL   BUN 9 6 - 20 mg/dL   Creatinine, Ser 7.82 0.61 - 1.24 mg/dL   Calcium 9.4 8.9 - 95.6 mg/dL   Total Protein 8.1 6.5 - 8.1 g/dL   Albumin 4.2 3.5 - 5.0 g/dL   AST 24 15 - 41 U/L   ALT 21 17 - 63 U/L   Alkaline Phosphatase 78 38 - 126 U/L   Total Bilirubin 0.3 0.3 - 1.2 mg/dL   GFR calc non Af Amer >60 >60 mL/min   GFR calc Af Amer >60 >60 mL/min   Anion gap 7 5 - 15  Troponin I     Status: None   Collection Time: 06/23/16 11:18 AM  Result Value Ref Range   Troponin I <0.03 <0.03 ng/mL   ____________________________________________  EKG My review and personal interpretation at Time: 11:14   Indication: dizziness  Rate: 60  Rhythm: sinus Axis: normal Other: unchanged from previous 04/28/16, no STEMI,normal intervals ____________________________________________  RADIOLOGY  I personally reviewed all radiographic images ordered to evaluate for the above acute complaints and reviewed radiology reports and findings.  These findings were personally discussed with the patient.  Please see medical record for radiology report. ____________________________________________   PROCEDURES  Procedure(s) performed:  Procedures    Critical Care performed: no ____________________________________________   INITIAL IMPRESSION / ASSESSMENT AND PLAN / ED COURSE  Pertinent labs & imaging results that were  available during my care of the patient were reviewed by me and considered in my medical decision making (see chart for details).  DDX: orthostasis, vertigo, cva, dehydration, electrolyte abn, anemia  Micheal Dunlap is a 65 y.o. who presents to the ED with 2 months of intermittent dizziness and lightheadedness.  Patient is AFVSS in ED. Exam as above. Given current presentation have considered the above differential.  Patient arrives well appearing and in no acute distress. Is not consistent with ACS as he denies any chest pain or pressure. There is no jaw pain, shoulder pain or nausea. His EKG is unchanged from previous dated November 4. Not consistent with congestive heart failure. CT of the head shows no evidence of acute abnormality. The patient has no focal neurodeficits. Based on his symptoms I do suspect  there is a component of mild dehydration as the patient is not orthostatic. Encouraged adequate hydration and discussed signs and symptoms for which the patient should return immediately to the hospital.  Have discussed with the patient and available family all diagnostics and treatments performed thus far and all questions were answered to the best of my ability. The patient demonstrates understanding and agreement with plan.   Clinical Course      ____________________________________________   FINAL CLINICAL IMPRESSION(S) / ED DIAGNOSES  Final diagnoses:  Lightheadedness  Dehydration      NEW MEDICATIONS STARTED DURING THIS VISIT:  New Prescriptions   No medications on file     Note:  This document was prepared using Dragon voice recognition software and may include unintentional dictation errors.    Willy EddyPatrick Leeland Lovelady, MD 06/23/16 [phone]

## 2016-06-23 NOTE — ED Notes (Signed)
Pt. Verbalizes understanding of d/c instructions and follow-up. VS stable and pain controlled per pt.  Pt. In NAD at time of d/c and denies further concerns regarding this visit. Pt. Stable at the time of departure from the unit, departing unit by the safest and most appropriate manner per that pt condition and limitations. Pt advised to return to the ED at any time for emergent concerns, or for new/worsening symptoms.   

## 2017-05-01 IMAGING — CR DG CHEST 2V
1 series · 2 of 2 positions shown · non-contrast
Comparison: May 23, 2012

CLINICAL DATA: Dizziness and hypertension

EXAM:
CHEST  2 VIEW

[Series 1: dg chest 2 view · 0.14mm/px · 2 of 2 slices shown]
[im 1/2]
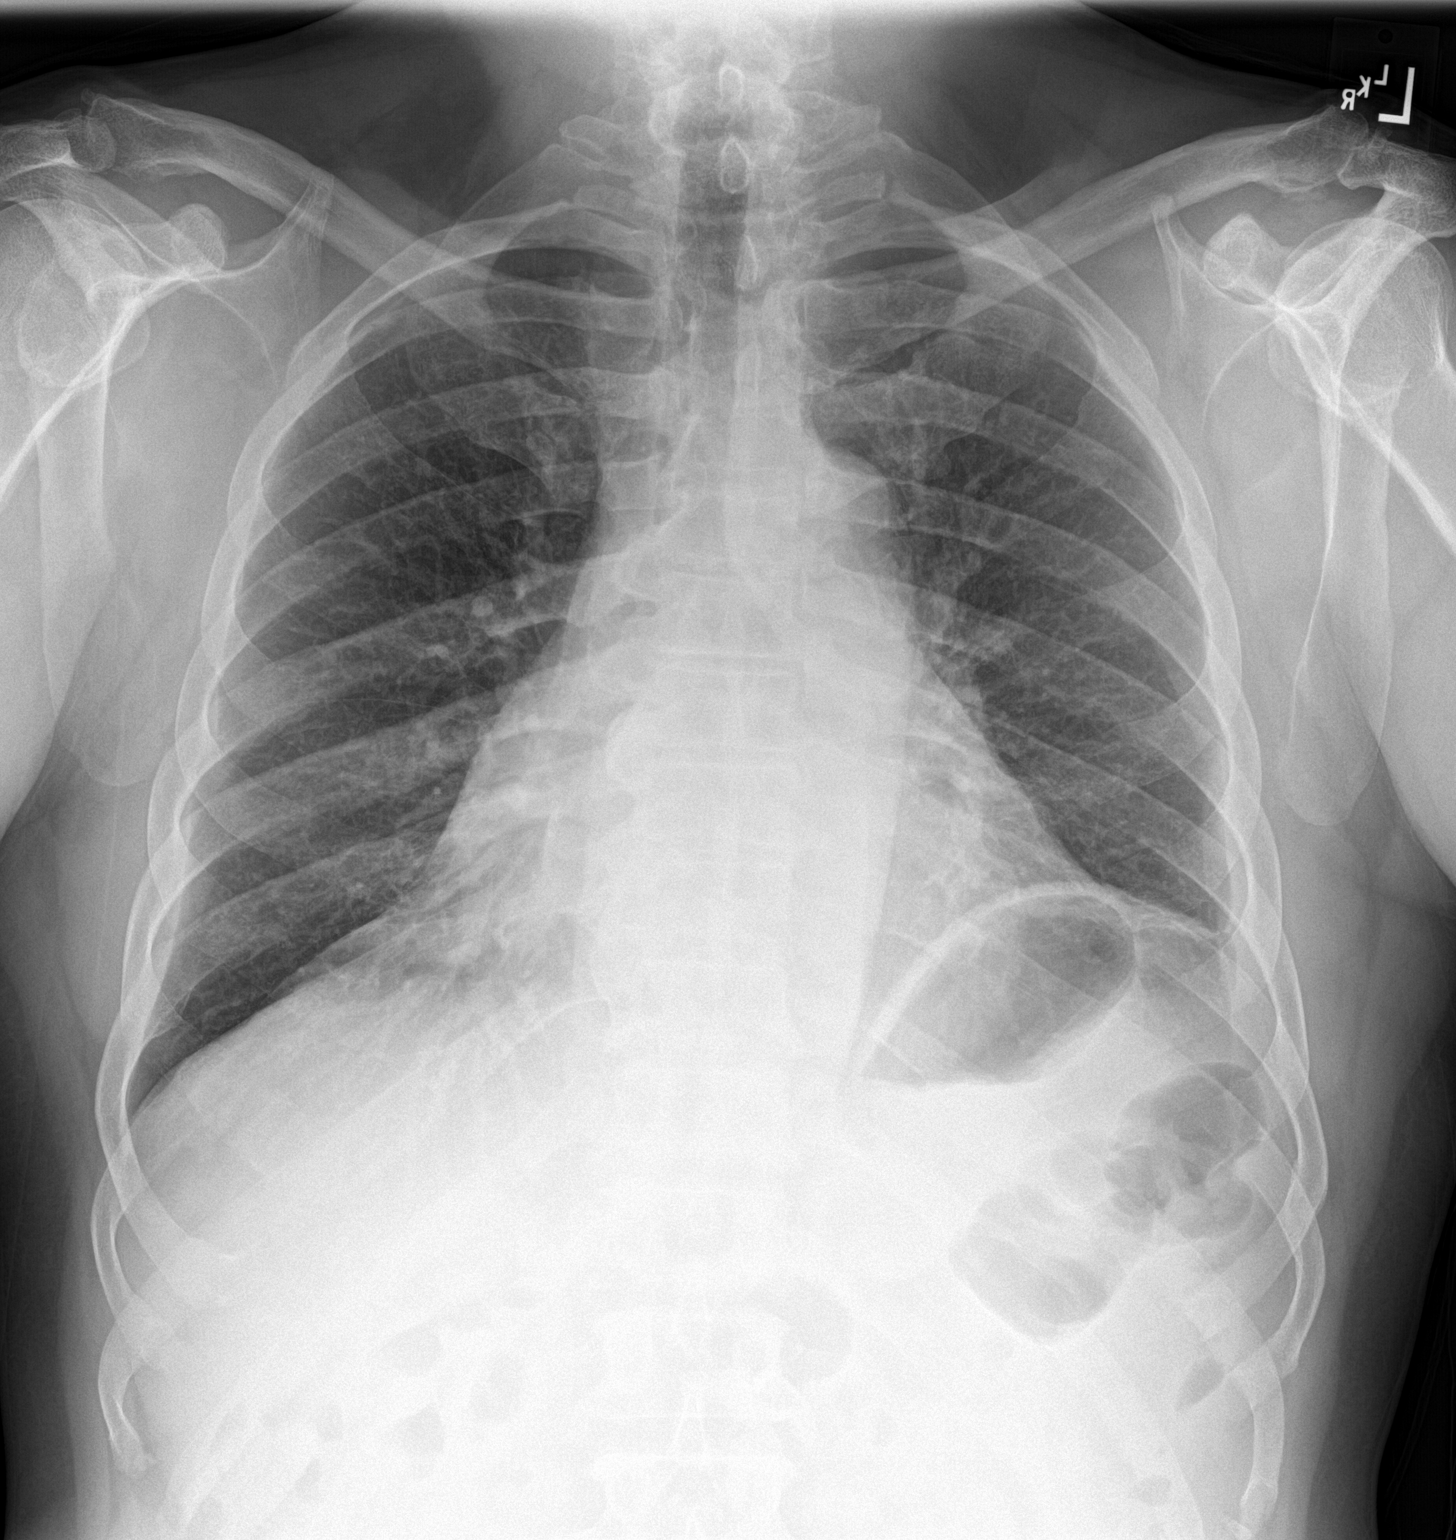
[im 2/2]
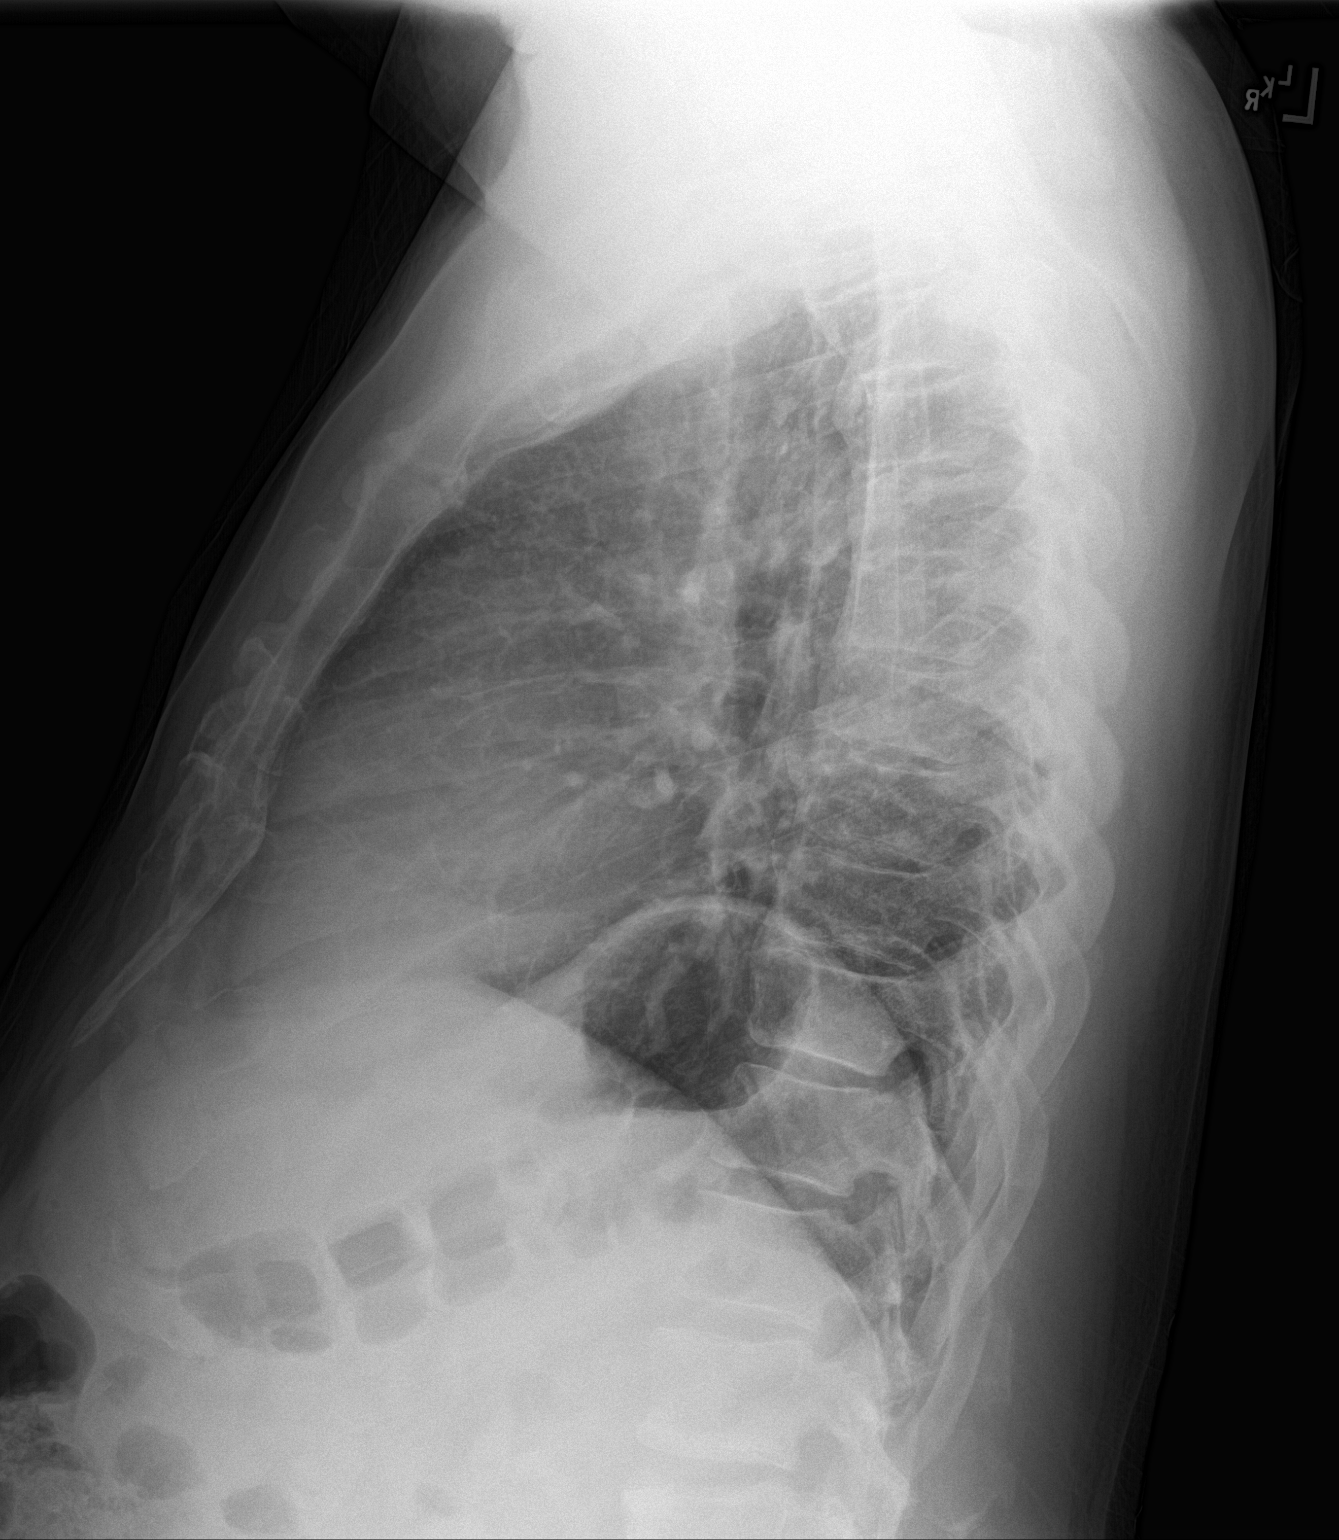

[2 of 2 positions shown; findings below may reference images not displayed]

FINDINGS: There is minimal scarring in the lateral left lung base. The lungs
elsewhere are clear. Heart is upper normal in size with pulmonary
vascularity within normal limits. No adenopathy. No bone lesions.
IMPRESSION: Slight scarring lateral left base. No edema or consolidation. Stable
cardiac silhouette.

## 2017-06-27 IMAGING — CT CT HEAD W/O CM
3 series · 15 of 46 positions shown, 18 images · non-contrast
Comparison: None.

CLINICAL DATA: Pt with dizziness that started 4 months ago and has
not gone away. Pt states it is worse today. Pt states that he has an
appointment with an ENT doctor, but does not want to wait to see
him. Pt denies any fall or trauma.

EXAM:
CT HEAD WITHOUT CONTRAST
TECHNIQUE: Contiguous axial images were obtained from the base of the skull
through the vertex without intravenous contrast.

[Series 2: head wo · axial · 0.41mm/px · z∈[-142,-22]mm · 9 of 29 slices shown, 12 images]
[im 3/29  brain]
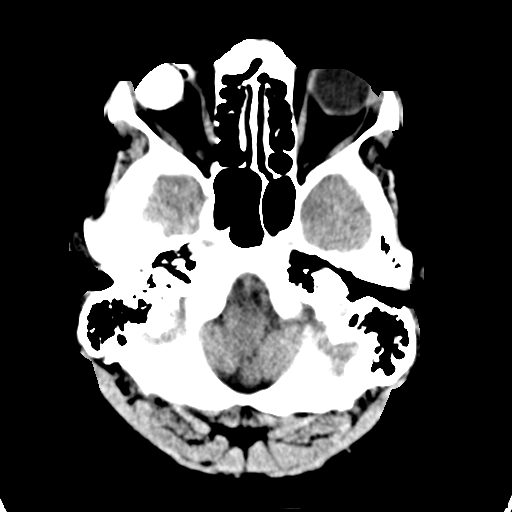
[im 3/29  bone]
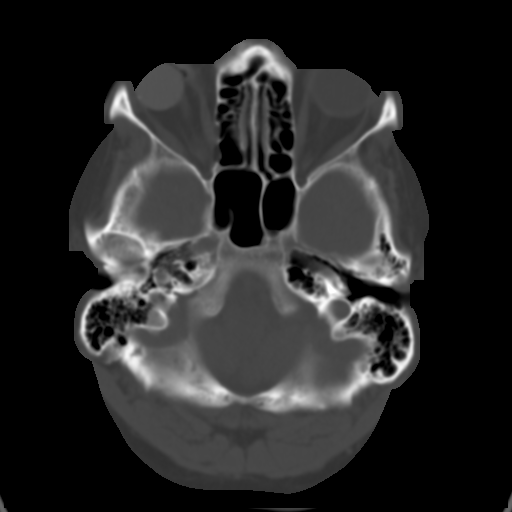
[im 6/29  brain]
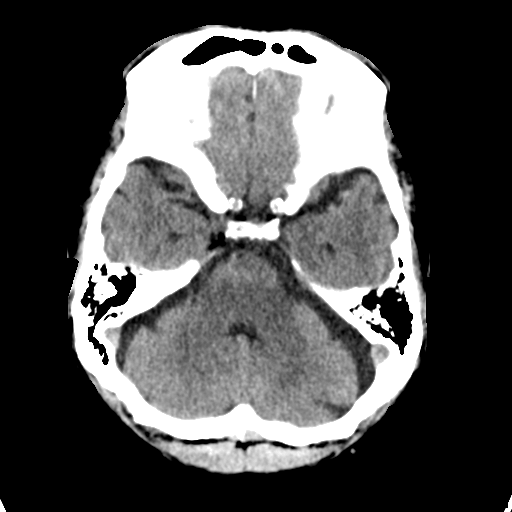
[im 9/29  brain]
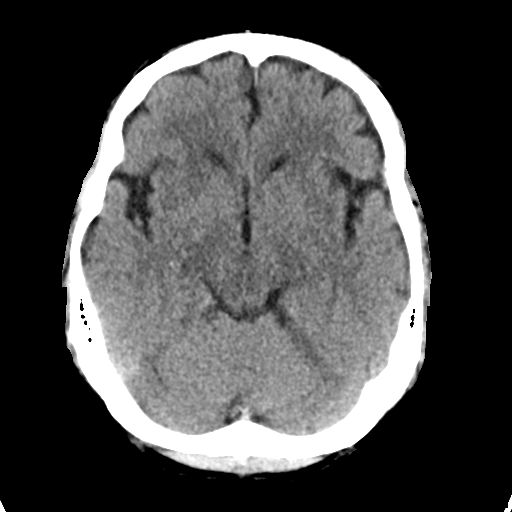
[im 12/29  brain]
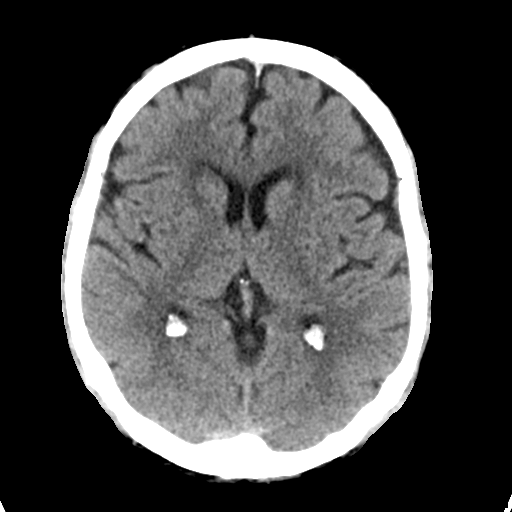
[im 15/29  brain]
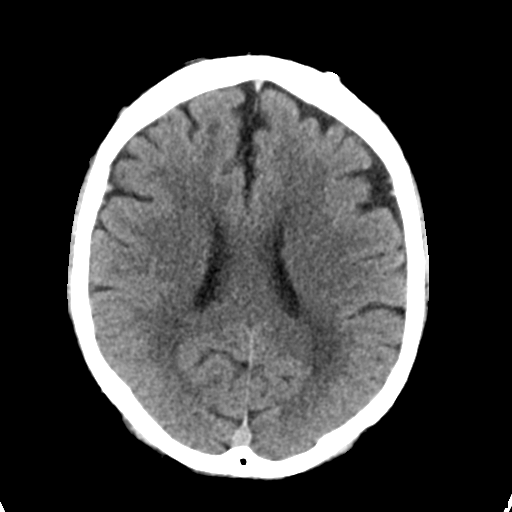
[im 15/29  bone]
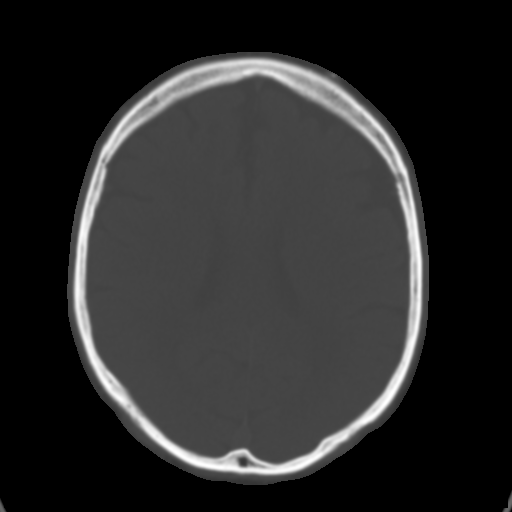
[im 18/29  brain]
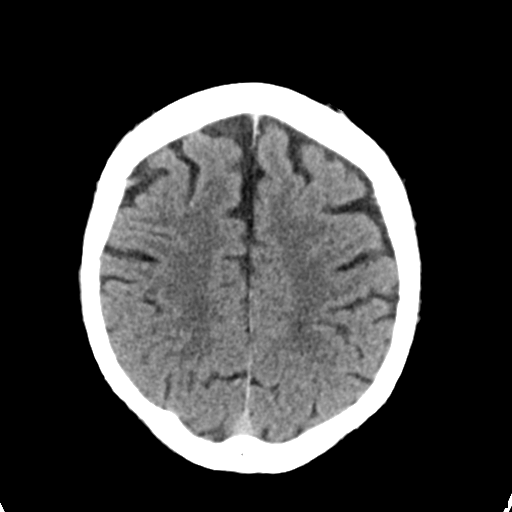
[im 21/29  brain]
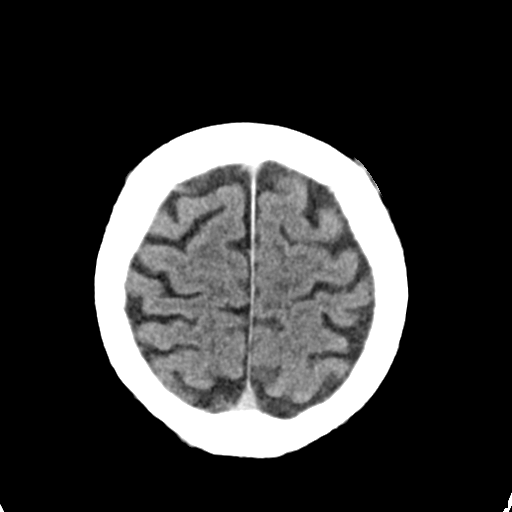
[im 24/29  brain]
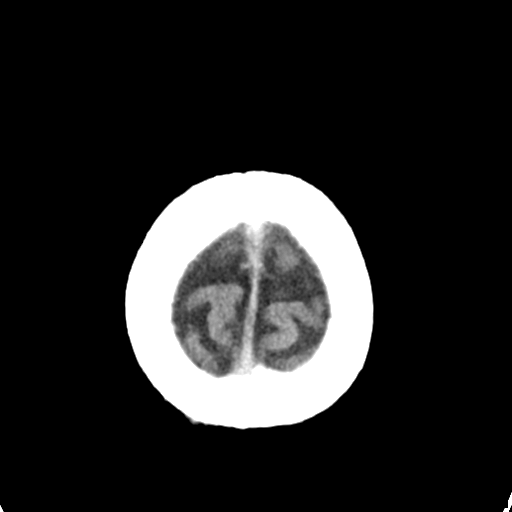
[im 27/29  brain]
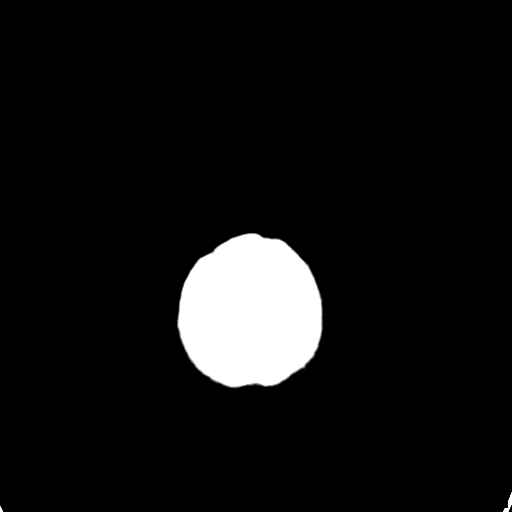
[im 27/29  bone]
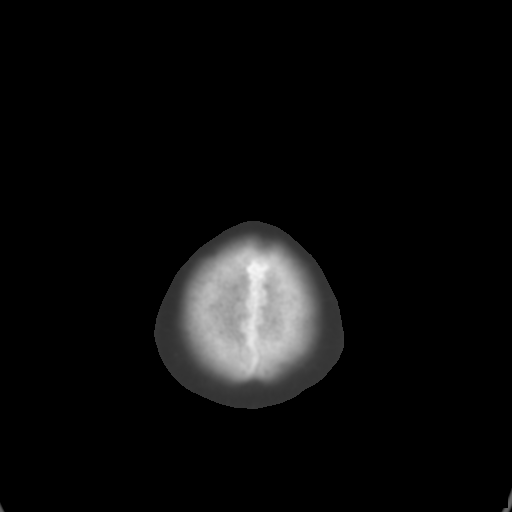

[Series 4: coronal soft tissue · coronal · 0.29mm/px · 3 of 64 slices shown]
[im 22/64  brain]
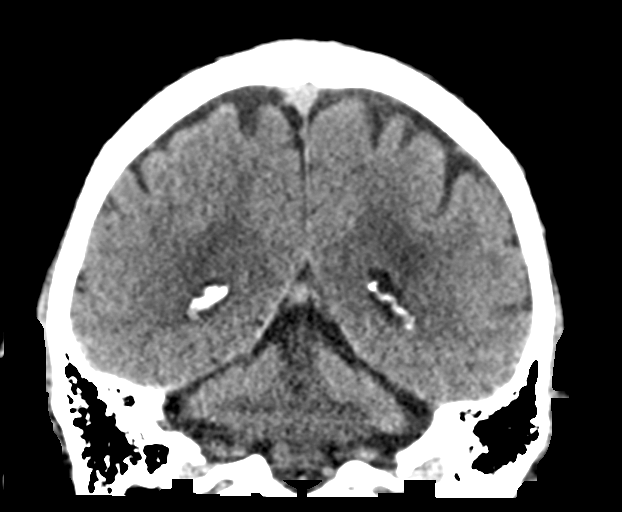
[im 29/64  brain]
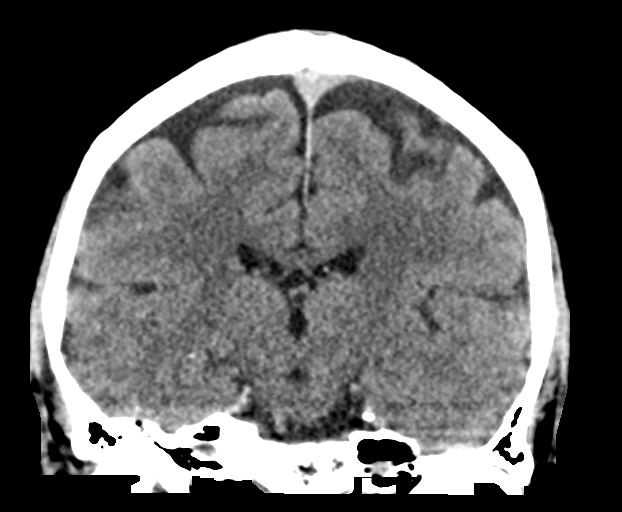
[im 36/64  brain]
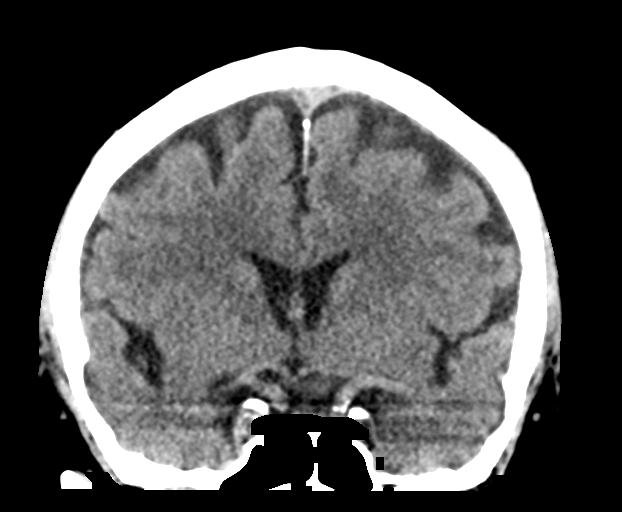

[Series 5: sagittal soft tissue · sagittal · 0.29mm/px · 3 of 55 slices shown]
[im 19/55  brain]
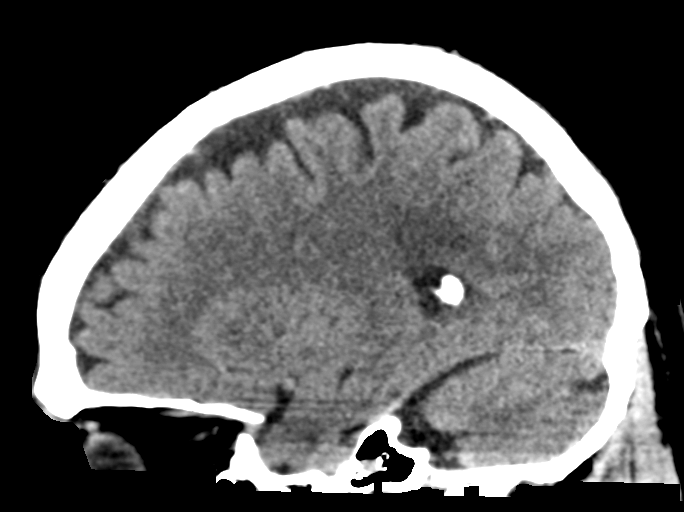
[im 28/55  brain]
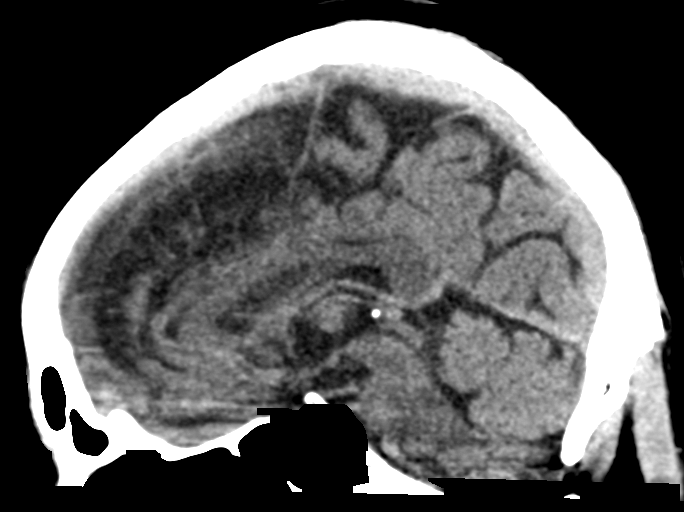
[im 37/55  brain]
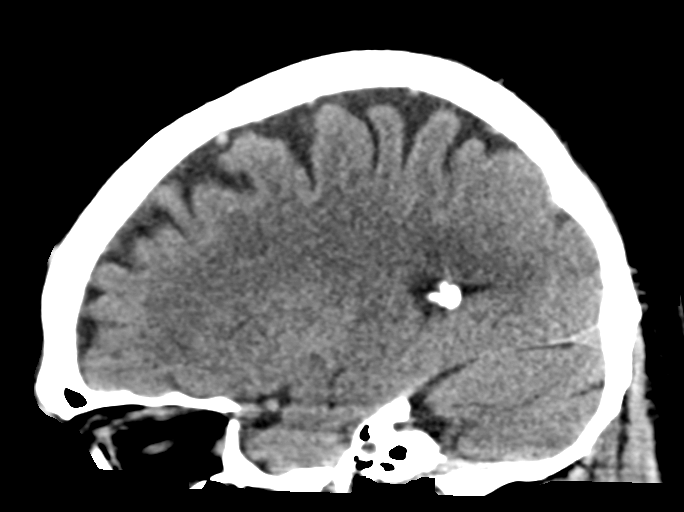

[15 of 46 positions shown; findings below may reference images not displayed]

FINDINGS: Brain: No evidence of acute infarction, hemorrhage, hydrocephalus,
extra-axial collection or mass lesion/mass effect.

There is mild periventricular white matter hypoattenuation
consistent with chronic microvascular ischemic change.

Vascular: No hyperdense vessel or unexpected calcification.

Skull: Normal. Negative for fracture or focal lesion.

Sinuses/Orbits: Right prosthetic globe. Left globe and orbit are
unremarkable. Visualized sinuses and mastoid air cells are clear.

Other: None.
IMPRESSION: 1. No acute intracranial abnormalities.
2. Mild chronic microvascular ischemic change.

## 2018-02-04 ENCOUNTER — Encounter: Payer: Self-pay | Admitting: Urology

## 2018-02-04 ENCOUNTER — Ambulatory Visit: Payer: Medicare Other | Admitting: Urology

## 2021-12-28 ENCOUNTER — Telehealth: Payer: Self-pay

## 2021-12-28 NOTE — Telephone Encounter (Signed)
Colonoscopy referral received.  Unable to contact patient to schedule.  "Call cannot be completed as dialed". No other phone number listed for contact.  Thanks, [place], [place]

## 2022-01-02 ENCOUNTER — Telehealth: Payer: Self-pay

## 2022-01-02 ENCOUNTER — Encounter: Payer: Self-pay | Admitting: Physician Assistant

## 2022-01-02 NOTE — Telephone Encounter (Signed)
Could not get in touch with patient phone number didn't work letter sent

## 2022-08-28 ENCOUNTER — Other Ambulatory Visit: Payer: Self-pay | Admitting: Physician Assistant

## 2022-08-28 DIAGNOSIS — R4189 Other symptoms and signs involving cognitive functions and awareness: Secondary | ICD-10-CM

## 2022-09-03 ENCOUNTER — Encounter: Payer: Self-pay | Admitting: Physician Assistant

## 2022-09-10 ENCOUNTER — Encounter: Payer: Self-pay | Admitting: *Deleted
